# Patient Record
Sex: Female | Born: 1945 | Race: White | Hispanic: No | Smoking: Never smoker
Health system: Southern US, Community
[De-identification: ages and names within clinical notes are randomized; demographics above are authoritative.]

## PROBLEM LIST (undated history)

## (undated) DIAGNOSIS — I10 Essential (primary) hypertension: Secondary | ICD-10-CM

## (undated) DIAGNOSIS — H269 Unspecified cataract: Secondary | ICD-10-CM

## (undated) DIAGNOSIS — G43909 Migraine, unspecified, not intractable, without status migrainosus: Secondary | ICD-10-CM

## (undated) HISTORY — PX: TUBAL LIGATION: SHX77

---

## 2014-03-10 DIAGNOSIS — Z1211 Encounter for screening for malignant neoplasm of colon: Secondary | ICD-10-CM | POA: Diagnosis not present

## 2014-03-10 DIAGNOSIS — G43909 Migraine, unspecified, not intractable, without status migrainosus: Secondary | ICD-10-CM | POA: Diagnosis not present

## 2014-03-10 DIAGNOSIS — Z8249 Family history of ischemic heart disease and other diseases of the circulatory system: Secondary | ICD-10-CM | POA: Diagnosis not present

## 2014-07-20 DIAGNOSIS — Z1231 Encounter for screening mammogram for malignant neoplasm of breast: Secondary | ICD-10-CM | POA: Diagnosis not present

## 2014-07-30 DIAGNOSIS — Z961 Presence of intraocular lens: Secondary | ICD-10-CM | POA: Diagnosis not present

## 2014-08-04 DIAGNOSIS — G43709 Chronic migraine without aura, not intractable, without status migrainosus: Secondary | ICD-10-CM | POA: Diagnosis not present

## 2014-08-04 DIAGNOSIS — R350 Frequency of micturition: Secondary | ICD-10-CM | POA: Diagnosis not present

## 2014-08-04 DIAGNOSIS — F419 Anxiety disorder, unspecified: Secondary | ICD-10-CM | POA: Diagnosis not present

## 2014-10-06 DIAGNOSIS — R350 Frequency of micturition: Secondary | ICD-10-CM | POA: Diagnosis not present

## 2014-10-06 DIAGNOSIS — R21 Rash and other nonspecific skin eruption: Secondary | ICD-10-CM | POA: Diagnosis not present

## 2015-01-13 DIAGNOSIS — F419 Anxiety disorder, unspecified: Secondary | ICD-10-CM | POA: Diagnosis not present

## 2015-01-13 DIAGNOSIS — G43709 Chronic migraine without aura, not intractable, without status migrainosus: Secondary | ICD-10-CM | POA: Diagnosis not present

## 2015-01-13 DIAGNOSIS — R350 Frequency of micturition: Secondary | ICD-10-CM | POA: Diagnosis not present

## 2015-01-14 DIAGNOSIS — F419 Anxiety disorder, unspecified: Secondary | ICD-10-CM | POA: Diagnosis not present

## 2015-01-20 DIAGNOSIS — Z0000 Encounter for general adult medical examination without abnormal findings: Secondary | ICD-10-CM | POA: Diagnosis not present

## 2015-01-20 DIAGNOSIS — Z23 Encounter for immunization: Secondary | ICD-10-CM | POA: Diagnosis not present

## 2015-07-20 DIAGNOSIS — G43709 Chronic migraine without aura, not intractable, without status migrainosus: Secondary | ICD-10-CM | POA: Diagnosis not present

## 2015-07-20 DIAGNOSIS — R5383 Other fatigue: Secondary | ICD-10-CM | POA: Diagnosis not present

## 2015-07-20 DIAGNOSIS — F419 Anxiety disorder, unspecified: Secondary | ICD-10-CM | POA: Diagnosis not present

## 2015-07-22 DIAGNOSIS — Z1231 Encounter for screening mammogram for malignant neoplasm of breast: Secondary | ICD-10-CM | POA: Diagnosis not present

## 2015-08-05 DIAGNOSIS — R5383 Other fatigue: Secondary | ICD-10-CM | POA: Diagnosis not present

## 2015-09-08 DIAGNOSIS — R5383 Other fatigue: Secondary | ICD-10-CM | POA: Diagnosis not present

## 2015-09-08 DIAGNOSIS — D518 Other vitamin B12 deficiency anemias: Secondary | ICD-10-CM | POA: Diagnosis not present

## 2015-10-11 DIAGNOSIS — D518 Other vitamin B12 deficiency anemias: Secondary | ICD-10-CM | POA: Diagnosis not present

## 2015-10-20 DIAGNOSIS — D518 Other vitamin B12 deficiency anemias: Secondary | ICD-10-CM | POA: Diagnosis not present

## 2015-10-20 DIAGNOSIS — E559 Vitamin D deficiency, unspecified: Secondary | ICD-10-CM | POA: Diagnosis not present

## 2015-11-11 DIAGNOSIS — D518 Other vitamin B12 deficiency anemias: Secondary | ICD-10-CM | POA: Diagnosis not present

## 2015-11-26 DIAGNOSIS — M1712 Unilateral primary osteoarthritis, left knee: Secondary | ICD-10-CM | POA: Diagnosis not present

## 2015-11-26 DIAGNOSIS — M23222 Derangement of posterior horn of medial meniscus due to old tear or injury, left knee: Secondary | ICD-10-CM | POA: Diagnosis not present

## 2015-12-06 DIAGNOSIS — M23222 Derangement of posterior horn of medial meniscus due to old tear or injury, left knee: Secondary | ICD-10-CM | POA: Diagnosis not present

## 2015-12-06 DIAGNOSIS — M1712 Unilateral primary osteoarthritis, left knee: Secondary | ICD-10-CM | POA: Diagnosis not present

## 2015-12-14 DIAGNOSIS — D518 Other vitamin B12 deficiency anemias: Secondary | ICD-10-CM | POA: Diagnosis not present

## 2015-12-16 DIAGNOSIS — Z23 Encounter for immunization: Secondary | ICD-10-CM | POA: Diagnosis not present

## 2016-01-18 DIAGNOSIS — E559 Vitamin D deficiency, unspecified: Secondary | ICD-10-CM | POA: Diagnosis not present

## 2016-01-18 DIAGNOSIS — D518 Other vitamin B12 deficiency anemias: Secondary | ICD-10-CM | POA: Diagnosis not present

## 2016-02-11 DIAGNOSIS — R87610 Atypical squamous cells of undetermined significance on cytologic smear of cervix (ASC-US): Secondary | ICD-10-CM | POA: Diagnosis not present

## 2016-02-11 DIAGNOSIS — R85611 Atypical squamous cells cannot exclude high grade squamous intraepithelial lesion on cytologic smear of anus (ASC-H): Secondary | ICD-10-CM | POA: Diagnosis not present

## 2016-02-11 DIAGNOSIS — D518 Other vitamin B12 deficiency anemias: Secondary | ICD-10-CM | POA: Diagnosis not present

## 2016-02-11 DIAGNOSIS — Z0000 Encounter for general adult medical examination without abnormal findings: Secondary | ICD-10-CM | POA: Diagnosis not present

## 2016-02-11 DIAGNOSIS — Z23 Encounter for immunization: Secondary | ICD-10-CM | POA: Diagnosis not present

## 2016-03-10 DIAGNOSIS — R87610 Atypical squamous cells of undetermined significance on cytologic smear of cervix (ASC-US): Secondary | ICD-10-CM | POA: Diagnosis not present

## 2016-03-28 DIAGNOSIS — H524 Presbyopia: Secondary | ICD-10-CM | POA: Diagnosis not present

## 2016-04-24 DIAGNOSIS — R51 Headache: Secondary | ICD-10-CM | POA: Diagnosis not present

## 2016-04-24 DIAGNOSIS — J019 Acute sinusitis, unspecified: Secondary | ICD-10-CM | POA: Diagnosis not present

## 2016-04-24 DIAGNOSIS — R05 Cough: Secondary | ICD-10-CM | POA: Diagnosis not present

## 2016-05-18 DIAGNOSIS — Z8262 Family history of osteoporosis: Secondary | ICD-10-CM | POA: Diagnosis not present

## 2016-05-18 DIAGNOSIS — Z79899 Other long term (current) drug therapy: Secondary | ICD-10-CM | POA: Diagnosis not present

## 2016-05-18 DIAGNOSIS — Z780 Asymptomatic menopausal state: Secondary | ICD-10-CM | POA: Diagnosis not present

## 2016-05-18 DIAGNOSIS — M810 Age-related osteoporosis without current pathological fracture: Secondary | ICD-10-CM | POA: Diagnosis not present

## 2016-05-18 DIAGNOSIS — M1990 Unspecified osteoarthritis, unspecified site: Secondary | ICD-10-CM | POA: Diagnosis not present

## 2016-08-04 DIAGNOSIS — Z1231 Encounter for screening mammogram for malignant neoplasm of breast: Secondary | ICD-10-CM | POA: Diagnosis not present

## 2016-08-09 DIAGNOSIS — Z1389 Encounter for screening for other disorder: Secondary | ICD-10-CM | POA: Diagnosis not present

## 2016-08-09 DIAGNOSIS — M85842 Other specified disorders of bone density and structure, left hand: Secondary | ICD-10-CM | POA: Diagnosis not present

## 2016-08-09 DIAGNOSIS — R51 Headache: Secondary | ICD-10-CM | POA: Diagnosis not present

## 2016-08-09 DIAGNOSIS — R350 Frequency of micturition: Secondary | ICD-10-CM | POA: Diagnosis not present

## 2017-01-02 DIAGNOSIS — Z23 Encounter for immunization: Secondary | ICD-10-CM | POA: Diagnosis not present

## 2017-02-22 DIAGNOSIS — Z23 Encounter for immunization: Secondary | ICD-10-CM | POA: Diagnosis not present

## 2017-02-22 DIAGNOSIS — Z0000 Encounter for general adult medical examination without abnormal findings: Secondary | ICD-10-CM | POA: Diagnosis not present

## 2017-07-04 DIAGNOSIS — H04121 Dry eye syndrome of right lacrimal gland: Secondary | ICD-10-CM | POA: Diagnosis not present

## 2017-07-04 DIAGNOSIS — H524 Presbyopia: Secondary | ICD-10-CM | POA: Diagnosis not present

## 2017-08-16 DIAGNOSIS — Z1231 Encounter for screening mammogram for malignant neoplasm of breast: Secondary | ICD-10-CM | POA: Diagnosis not present

## 2018-02-22 ENCOUNTER — Emergency Department (HOSPITAL_COMMUNITY)
Admission: EM | Admit: 2018-02-22 | Discharge: 2018-02-22 | Disposition: A | Discharge status: Home / Self Care | Payer: Medicare Other | Attending: Emergency Medicine | Admitting: Emergency Medicine

## 2018-02-22 ENCOUNTER — Emergency Department (HOSPITAL_COMMUNITY): Payer: Medicare Other

## 2018-02-22 ENCOUNTER — Other Ambulatory Visit: Payer: Self-pay

## 2018-02-22 ENCOUNTER — Encounter (HOSPITAL_COMMUNITY): Payer: Self-pay | Admitting: Emergency Medicine

## 2018-02-22 DIAGNOSIS — R1084 Generalized abdominal pain: Secondary | ICD-10-CM | POA: Diagnosis not present

## 2018-02-22 DIAGNOSIS — N3001 Acute cystitis with hematuria: Secondary | ICD-10-CM | POA: Diagnosis not present

## 2018-02-22 DIAGNOSIS — R109 Unspecified abdominal pain: Secondary | ICD-10-CM | POA: Diagnosis not present

## 2018-02-22 HISTORY — DX: Migraine, unspecified, not intractable, without status migrainosus: G43.909

## 2018-02-22 LAB — CBC WITH DIFFERENTIAL/PLATELET
Abs Immature Granulocytes: 0.03 10*3/uL (ref 0.00–0.07)
Basophils Absolute: 0.1 10*3/uL (ref 0.0–0.1)
Basophils Relative: 1 %
Eosinophils Absolute: 0.3 10*3/uL (ref 0.0–0.5)
Eosinophils Relative: 3 %
HCT: 40.2 % (ref 36.0–46.0)
Hemoglobin: 13.1 g/dL (ref 12.0–15.0)
Immature Granulocytes: 0 %
Lymphocytes Relative: 19 %
Lymphs Abs: 1.8 10*3/uL (ref 0.7–4.0)
MCH: 30.8 pg (ref 26.0–34.0)
MCHC: 32.6 g/dL (ref 30.0–36.0)
MCV: 94.6 fL (ref 80.0–100.0)
Monocytes Absolute: 0.8 10*3/uL (ref 0.1–1.0)
Monocytes Relative: 9 %
NEUTROS ABS: 6.4 10*3/uL (ref 1.7–7.7)
NEUTROS PCT: 68 %
Platelets: 335 10*3/uL (ref 150–400)
RBC: 4.25 MIL/uL (ref 3.87–5.11)
RDW: 13.4 % (ref 11.5–15.5)
WBC: 9.3 10*3/uL (ref 4.0–10.5)
nRBC: 0 % (ref 0.0–0.2)

## 2018-02-22 LAB — URINALYSIS, ROUTINE W REFLEX MICROSCOPIC
Bilirubin Urine: NEGATIVE
Glucose, UA: NEGATIVE mg/dL
Ketones, ur: NEGATIVE mg/dL
Nitrite: NEGATIVE
PH: 5 (ref 5.0–8.0)
Protein, ur: 30 mg/dL — AB
RBC / HPF: >50 RBC/hpf — ABNORMAL HIGH (ref 0–5)
Specific Gravity, Urine: 1.029 (ref 1.005–1.030)

## 2018-02-22 LAB — COMPREHENSIVE METABOLIC PANEL
ALBUMIN: 4.4 g/dL (ref 3.5–5.0)
ALT: 33 U/L (ref 0–44)
AST: 39 U/L (ref 15–41)
Alkaline Phosphatase: 67 U/L (ref 38–126)
Anion gap: 10 (ref 5–15)
BUN: 24 mg/dL — ABNORMAL HIGH (ref 8–23)
CHLORIDE: 108 mmol/L (ref 98–111)
CO2: 22 mmol/L (ref 22–32)
Calcium: 9.4 mg/dL (ref 8.9–10.3)
Creatinine, Ser: 0.81 mg/dL (ref 0.44–1.00)
GFR calc Af Amer: >60 mL/min (ref >60)
GFR calc non Af Amer: >60 mL/min (ref >60)
Glucose, Bld: 84 mg/dL (ref 70–99)
POTASSIUM: 4.1 mmol/L (ref 3.5–5.1)
Sodium: 140 mmol/L (ref 135–145)
Total Bilirubin: 1 mg/dL (ref 0.3–1.2)
Total Protein: 7.3 g/dL (ref 6.5–8.1)

## 2018-02-22 LAB — LIPASE, BLOOD: Lipase: 34 U/L (ref 11–51)

## 2018-02-22 MED ORDER — SODIUM CHLORIDE 0.9 % IV SOLN
INTRAVENOUS | Status: DC
  Administered 2018-02-22: 19:00:00 via INTRAVENOUS

## 2018-02-22 MED ORDER — ONDANSETRON HCL 4 MG/2ML IJ SOLN
4.0000 mg | Freq: Once | INTRAMUSCULAR | Status: AC
  Administered 2018-02-22: 4 mg via INTRAVENOUS
  Filled 2018-02-22: qty 2

## 2018-02-22 MED ORDER — MORPHINE SULFATE (PF) 4 MG/ML IV SOLN
4.0000 mg | Freq: Once | INTRAVENOUS | Status: AC
  Administered 2018-02-22: 4 mg via INTRAVENOUS
  Filled 2018-02-22: qty 1

## 2018-02-22 MED ORDER — SODIUM CHLORIDE 0.9 % IV BOLUS
250.0000 mL | Freq: Once | INTRAVENOUS | Status: AC
  Administered 2018-02-22: 250 mL via INTRAVENOUS

## 2018-02-22 MED ORDER — SODIUM CHLORIDE 0.9 % IV SOLN
1.0000 g | Freq: Once | INTRAVENOUS | Status: AC
  Administered 2018-02-22: 1 g via INTRAVENOUS
  Filled 2018-02-22: qty 10

## 2018-02-22 MED ORDER — IOPAMIDOL (ISOVUE-300) INJECTION 61%
80.0000 mL | Freq: Once | INTRAVENOUS | Status: AC | PRN
  Administered 2018-02-22: 80 mL via INTRAVENOUS

## 2018-02-22 MED ORDER — CEPHALEXIN 500 MG PO CAPS: 500.0000 mg | ORAL_CAPSULE | Freq: Four times a day (QID) | ORAL | 0 refills | Status: DC

## 2018-02-22 MED ORDER — ONDANSETRON HCL 4 MG/2ML IJ SOLN: 4.0000 mg | Freq: Once | INTRAMUSCULAR | Status: DC

## 2018-02-22 MED ORDER — ONDANSETRON 4 MG PO TBDP: 4.0000 mg | ORAL_TABLET | Freq: Three times a day (TID) | ORAL | 1 refills | Status: DC | PRN

## 2018-02-22 NOTE — ED Notes (Signed)
Patient transported to CT 

## 2018-02-22 NOTE — Discharge Instructions (Addendum)
CT scan without any significant findings.  Symptoms may be secondary to urinary tract infection.  Take the antibiotic Keflex as directed for the next 7 days.  Would expect improvement over the next 2 days.  Return for any new or worse symptoms.  He also a prescription for Zofran for any nausea or vomiting.

## 2018-02-22 NOTE — ED Notes (Signed)
Spoke with Chrissie NoaWilliam in CT- reports on way to get pt for scan.

## 2018-02-22 NOTE — ED Provider Notes (Signed)
[facility] EMERGENCY DEPARTMENT Provider Note   CSN: [id number] Arrival date & time: 02/22/18  1805     History   Chief Complaint Chief Complaint  Patient presents with  . Flank Pain    HPI Crystal Price is a 72 y.o. female.  HPI Patient presented to the emergency room for evaluation of flank pain.  Patient states the symptoms started about 15 minutes prior to arrival.  She was sitting down to eat.  She had a bite or 2 of beef when she had the pain which is sharp and severe.  It radiates to her back.  No nausea or vomiting.  No cp or sob.  No urinary sx.  Pt denies prior history of same. Past Medical History:  Diagnosis Date  . Migraine     There are no active problems to display for this patient.   Past Surgical History:  Procedure Laterality Date  . TUBAL LIGATION       OB History   No obstetric history on file.      Home Medications    Prior to Admission medications   Not on File    Family History History reviewed. No pertinent family history.  Social History Social History   Tobacco Use  . Smoking status: Never Smoker  . Smokeless tobacco: Never Used  Substance Use Topics  . Alcohol use: Yes    Frequency: Never    Comment: rarely  . Drug use: Never     Allergies   Patient has no known allergies.   Review of Systems Review of Systems  All other systems reviewed and are negative.    Physical Exam Updated Vital Signs BP (!) 177/99 (BP Location: Right Arm)   Pulse 84   Temp 97.8 F (36.6 C) (Oral)   Resp 20   Ht 1.6 m (5\' 3" )   Wt 68 kg   SpO2 99%   BMI 26.57 kg/m   Physical Exam Vitals signs and nursing note reviewed.  Constitutional:      General: She is not in acute distress.    Appearance: She is well-developed.  HENT:     Head: Normocephalic and atraumatic.     Right Ear: External ear normal.     Left Ear: External ear normal.  Eyes:     General: No scleral icterus.       Right eye: No discharge.        Left eye:  No discharge.     Conjunctiva/sclera: Conjunctivae normal.  Neck:     Musculoskeletal: Neck supple.     Trachea: No tracheal deviation.  Cardiovascular:     Rate and Rhythm: Normal rate and regular rhythm.  Pulmonary:     Effort: Pulmonary effort is normal. No respiratory distress.     Breath sounds: Normal breath sounds. No stridor. No wheezing or rales.  Abdominal:     General: Bowel sounds are normal. There is no distension.     Palpations: Abdomen is soft.     Tenderness: There is no abdominal tenderness. There is no guarding or rebound.  Musculoskeletal:        General: No tenderness.  Skin:    General: Skin is warm and dry.     Findings: No rash.  Neurological:     Mental Status: She is alert.     Cranial Nerves: No cranial nerve deficit (no facial droop, extraocular movements intact, no slurred speech).     Sensory: No sensory deficit.  Motor: No abnormal muscle tone or seizure activity.     Coordination: Coordination normal.      ED Treatments / Results  Labs (all labs ordered are listed, but only abnormal results are displayed) Labs Reviewed  COMPREHENSIVE METABOLIC PANEL  LIPASE, BLOOD  CBC WITH DIFFERENTIAL/PLATELET  URINALYSIS, ROUTINE W REFLEX MICROSCOPIC    EKG None  Radiology No results found.  Procedures Procedures (including critical care time)  Medications Ordered in ED Medications  sodium chloride 0.9 % bolus 250 mL (has no administration in time range)    And  0.9 %  sodium chloride infusion (has no administration in time range)  ondansetron (ZOFRAN) injection 4 mg (has no administration in time range)  morphine 4 MG/ML injection 4 mg (has no administration in time range)     Initial Impression / Assessment and Plan / ED Course  I have reviewed the triage vital signs and the nursing notes.  Pertinent labs & imaging results that were available during my care of the patient were reviewed by me and considered in my medical decision  making (see chart for details).  Clinical Course as of Feb 22 2157  Fri Feb 22, 2018  1952 Still having severe pain.  Requiring another dose of pain meds.  Will CT   [JK]    Clinical Course User Index [JK] Linwood DibblesKnapp, Arber Wiemers, MD    Labs reviewed.  UA notable for pyuria and hematuria.  CT scan pending.  If no gallstones or kidney stones, wiill treat for UTI.  Dr Deretha EmoryZackowski will follow up on the CT scan.  Final Clinical Impressions(s) / ED Diagnoses  pending   Linwood DibblesKnapp, Namir Neto, MD 02/22/18 2159

## 2018-02-22 NOTE — ED Triage Notes (Signed)
Patient complaining of right flank pain starting 15 minutes prior to arrival to ER. Denies dysuria.

## 2018-02-22 NOTE — ED Notes (Signed)
Pt back from CT

## 2018-02-22 NOTE — ED Notes (Signed)
Ct called - informed Crystal Price that pt is ready for scan.

## 2018-02-22 NOTE — ED Provider Notes (Signed)
Patient's CT scan without any significant abnormalities.  Gallbladder normal.  No evidence of any renal stones.  Based on the urinalysis symptoms may be secondary to urinary tract infection bladder infection.  Will treat with 1 dose of Rocephin here and continue Keflex.  Urine sent for culture.  Patient's vital signs here nontoxic no acute distress.   Results for orders placed or performed during the hospital encounter of 02/22/18  Comprehensive metabolic panel  Result Value Ref Range   Sodium 140 135 - 145 mmol/L   Potassium 4.1 3.5 - 5.1 mmol/L   Chloride 108 98 - 111 mmol/L   CO2 22 22 - 32 mmol/L   Glucose, Bld 84 70 - 99 mg/dL   BUN 24 (H) 8 - 23 mg/dL   Creatinine, Ser 1.910.81 0.44 - 1.00 mg/dL   Calcium 9.4 8.9 - 47.810.3 mg/dL   Total Protein 7.3 6.5 - 8.1 g/dL   Albumin 4.4 3.5 - 5.0 g/dL   AST 39 15 - 41 U/L   ALT 33 0 - 44 U/L   Alkaline Phosphatase 67 38 - 126 U/L   Total Bilirubin 1.0 0.3 - 1.2 mg/dL   GFR calc non Af Amer >60 >60 mL/min   GFR calc Af Amer >60 >60 mL/min   Anion gap 10 5 - 15  Lipase, blood  Result Value Ref Range   Lipase 34 11 - 51 U/L  CBC WITH DIFFERENTIAL  Result Value Ref Range   WBC 9.3 4.0 - 10.5 K/uL   RBC 4.25 3.87 - 5.11 MIL/uL   Hemoglobin 13.1 12.0 - 15.0 g/dL   HCT 29.540.2 62.136.0 - 30.846.0 %   MCV 94.6 80.0 - 100.0 fL   MCH 30.8 26.0 - 34.0 pg   MCHC 32.6 30.0 - 36.0 g/dL   RDW 65.713.4 84.611.5 - 96.215.5 %   Platelets 335 150 - 400 K/uL   nRBC 0.0 0.0 - 0.2 %   Neutrophils Relative % 68 %   Neutro Abs 6.4 1.7 - 7.7 K/uL   Lymphocytes Relative 19 %   Lymphs Abs 1.8 0.7 - 4.0 K/uL   Monocytes Relative 9 %   Monocytes Absolute 0.8 0.1 - 1.0 K/uL   Eosinophils Relative 3 %   Eosinophils Absolute 0.3 0.0 - 0.5 K/uL   Basophils Relative 1 %   Basophils Absolute 0.1 0.0 - 0.1 K/uL   Immature Granulocytes 0 %   Abs Immature Granulocytes 0.03 0.00 - 0.07 K/uL  Urinalysis, Routine w reflex microscopic  Result Value Ref Range   Color, Urine YELLOW YELLOW   APPearance HAZY (A) CLEAR   Specific Gravity, Urine 1.029 1.005 - 1.030   pH 5.0 5.0 - 8.0   Glucose, UA NEGATIVE NEGATIVE mg/dL   Hgb urine dipstick LARGE (A) NEGATIVE   Bilirubin Urine NEGATIVE NEGATIVE   Ketones, ur NEGATIVE NEGATIVE mg/dL   Protein, ur 30 (A) NEGATIVE mg/dL   Nitrite NEGATIVE NEGATIVE   Leukocytes, UA MODERATE (A) NEGATIVE   RBC / HPF >50 (H) 0 - 5 RBC/hpf   WBC, UA 21-50 0 - 5 WBC/hpf   Bacteria, UA RARE (A) NONE SEEN   Squamous Epithelial / LPF 0-5 0 - 5   Mucus PRESENT    Ca Oxalate Crys, UA PRESENT       Vanetta MuldersZackowski, Jacinto Keil, MD 02/22/18 2234

## 2018-02-24 LAB — URINE CULTURE

## 2018-03-26 DIAGNOSIS — J069 Acute upper respiratory infection, unspecified: Secondary | ICD-10-CM | POA: Diagnosis not present

## 2018-03-26 DIAGNOSIS — G43709 Chronic migraine without aura, not intractable, without status migrainosus: Secondary | ICD-10-CM | POA: Diagnosis not present

## 2018-03-26 DIAGNOSIS — Z0000 Encounter for general adult medical examination without abnormal findings: Secondary | ICD-10-CM | POA: Diagnosis not present

## 2018-09-05 DIAGNOSIS — M1711 Unilateral primary osteoarthritis, right knee: Secondary | ICD-10-CM | POA: Diagnosis not present

## 2018-09-05 DIAGNOSIS — M23303 Other meniscus derangements, unspecified medial meniscus, right knee: Secondary | ICD-10-CM | POA: Diagnosis not present

## 2018-11-18 DIAGNOSIS — Z1231 Encounter for screening mammogram for malignant neoplasm of breast: Secondary | ICD-10-CM | POA: Diagnosis not present

## 2019-04-28 DIAGNOSIS — G43709 Chronic migraine without aura, not intractable, without status migrainosus: Secondary | ICD-10-CM | POA: Diagnosis not present

## 2019-04-28 DIAGNOSIS — R350 Frequency of micturition: Secondary | ICD-10-CM | POA: Diagnosis not present

## 2019-10-10 DIAGNOSIS — Z13220 Encounter for screening for lipoid disorders: Secondary | ICD-10-CM | POA: Diagnosis not present

## 2019-10-10 DIAGNOSIS — R5383 Other fatigue: Secondary | ICD-10-CM | POA: Diagnosis not present

## 2019-10-16 DIAGNOSIS — Z0000 Encounter for general adult medical examination without abnormal findings: Secondary | ICD-10-CM | POA: Diagnosis not present

## 2019-10-16 DIAGNOSIS — G43709 Chronic migraine without aura, not intractable, without status migrainosus: Secondary | ICD-10-CM | POA: Diagnosis not present

## 2019-12-22 DIAGNOSIS — H524 Presbyopia: Secondary | ICD-10-CM | POA: Diagnosis not present

## 2020-02-07 IMAGING — CT CT ABD-PELV W/ CM
2 of 5 series · 16 of 46 positions shown, 18 images · IV contrast (Isovue)
Comparison: None.

CLINICAL DATA: Acute onset of right flank pain and generalized
abdominal pain.

EXAM:
CT ABDOMEN AND PELVIS WITH CONTRAST
TECHNIQUE: Multidetector CT imaging of the abdomen and pelvis was performed
using the standard protocol following bolus administration of
intravenous contrast.
CONTRAST:  80mL 6M78VO-MWW IOPAMIDOL (6M78VO-MWW) INJECTION 61%

[Series 2: axial st · axial · 0.67mm/px · z∈[+772,+1132]mm · 13 of 81 slices shown, 15 images]
[im 5/81  soft-tissue]
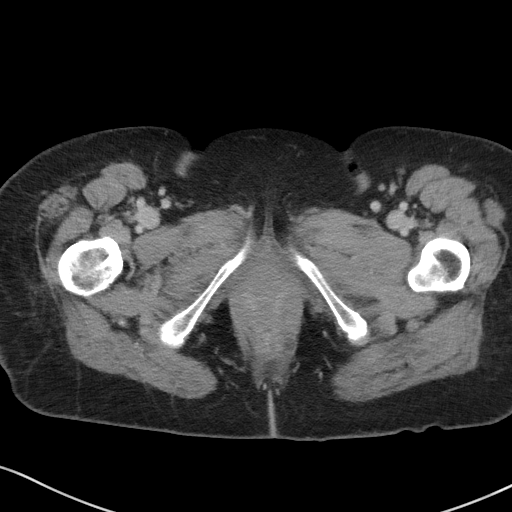
[im 5/81  bone]
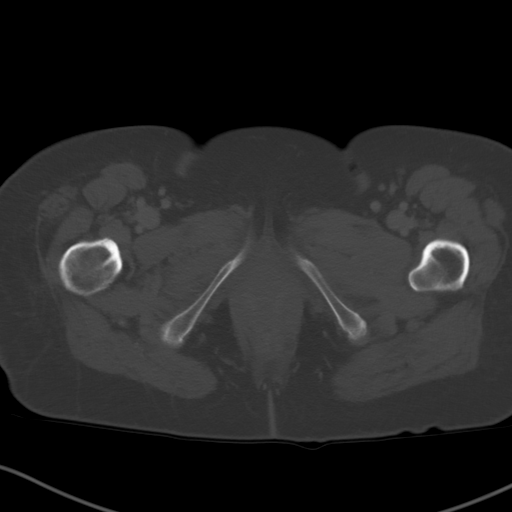
[im 13/81  soft-tissue]
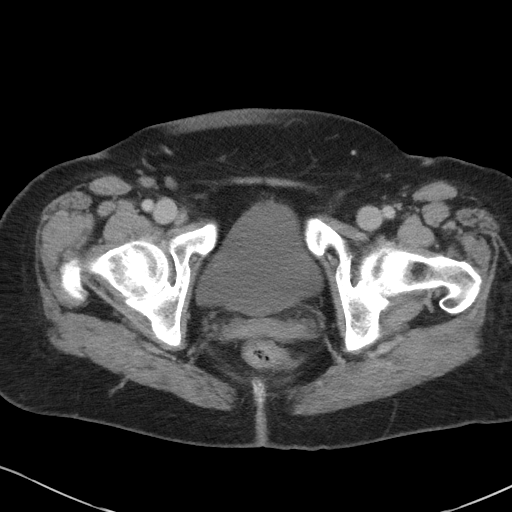
[im 17/81  soft-tissue]
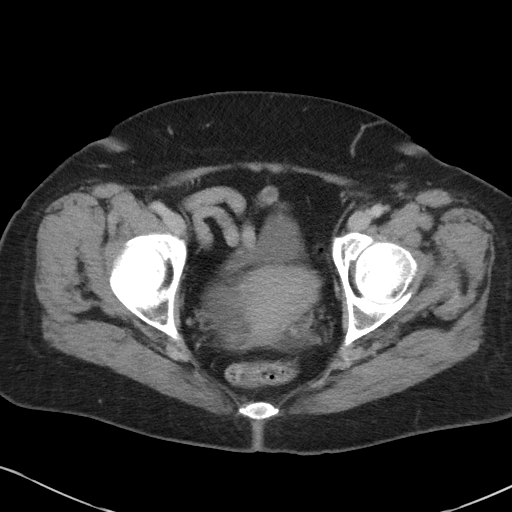
[im 25/81  soft-tissue]
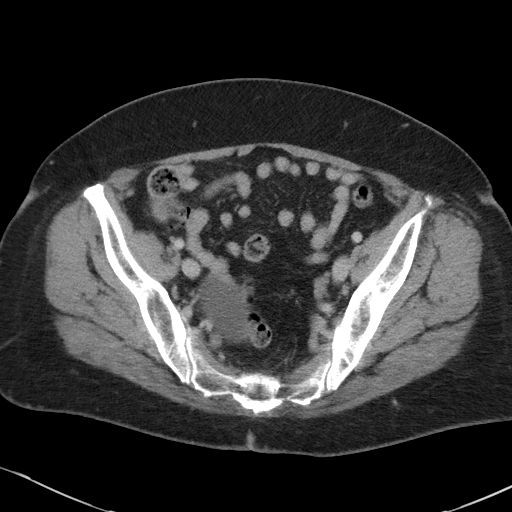
[im 29/81  soft-tissue]
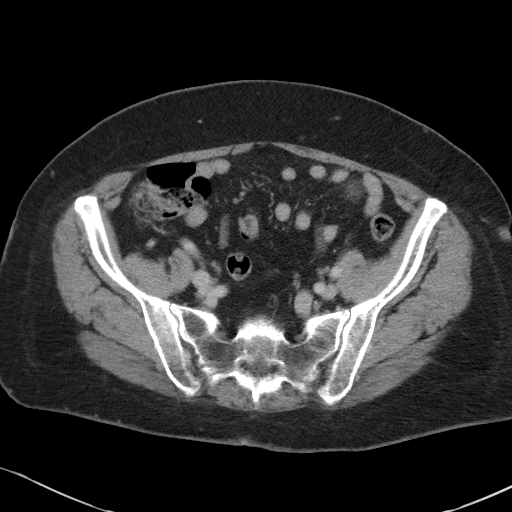
[im 37/81  soft-tissue]
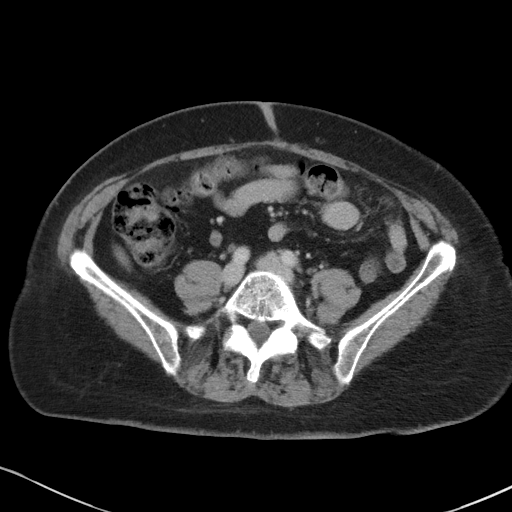
[im 41/81  soft-tissue]
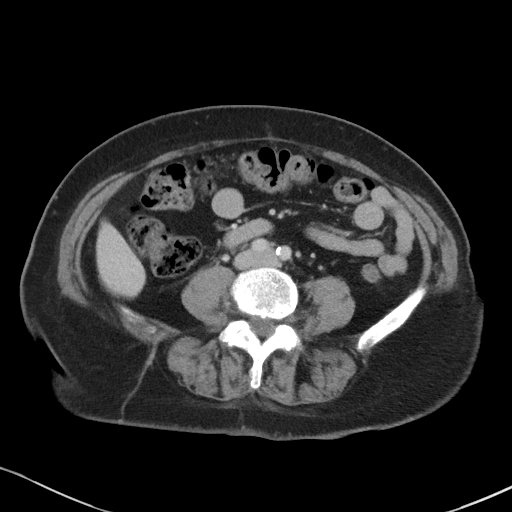
[im 45/81  soft-tissue]
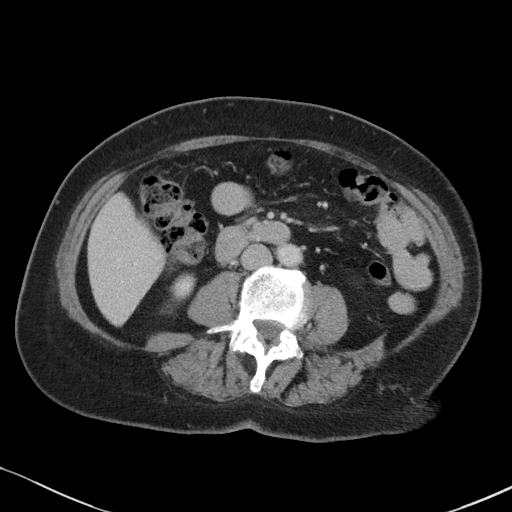
[im 53/81  soft-tissue]
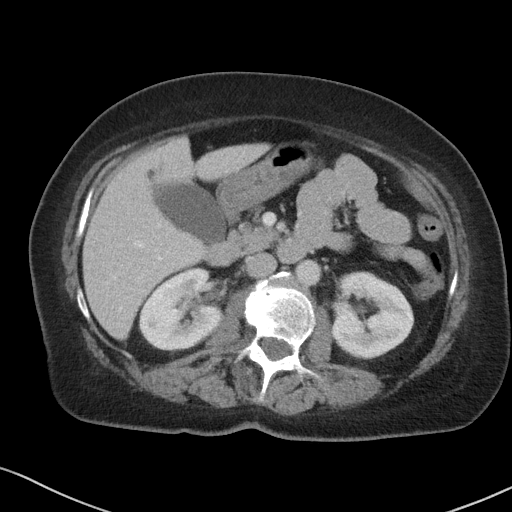
[im 53/81  bone]
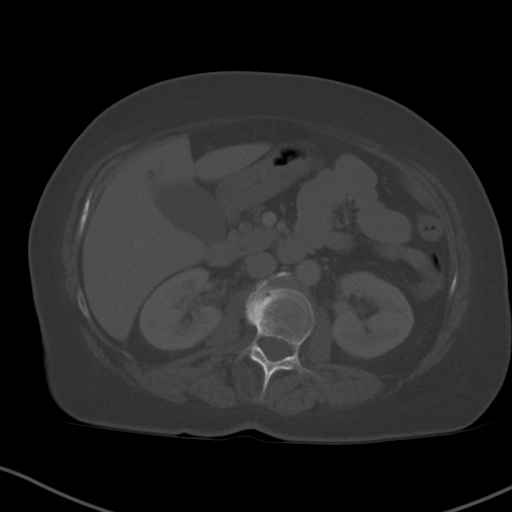
[im 57/81  soft-tissue]
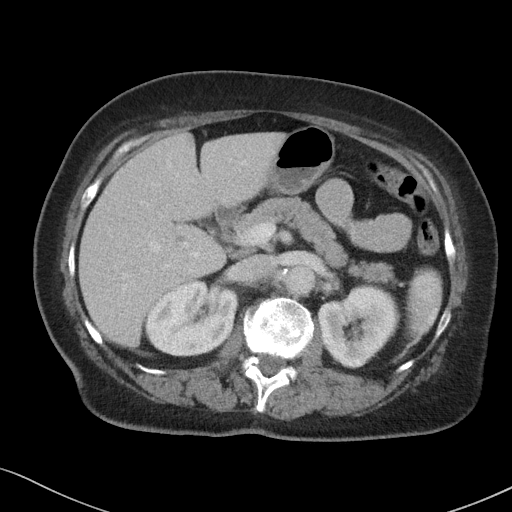
[im 65/81  soft-tissue]
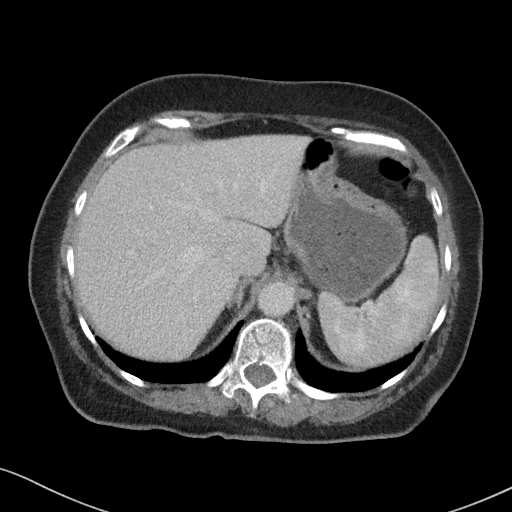
[im 69/81  soft-tissue]
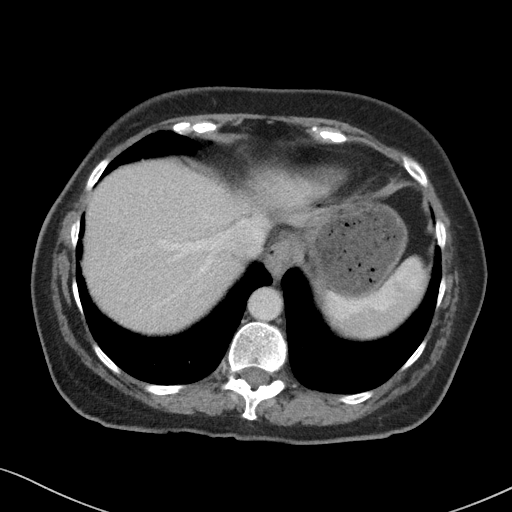
[im 77/81  soft-tissue]
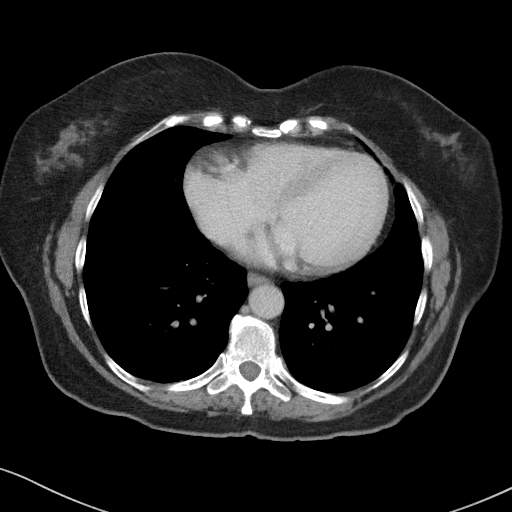

[Series 5: coronal st · coronal · 0.71mm/px · 3 of 87 slices shown]
[im 29/87  soft-tissue]
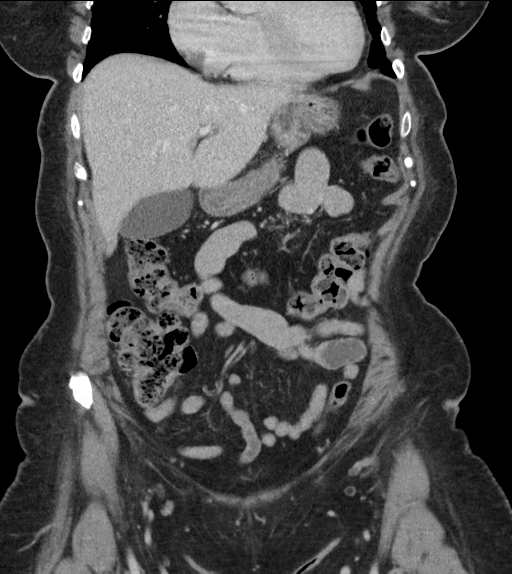
[im 39/87  soft-tissue]
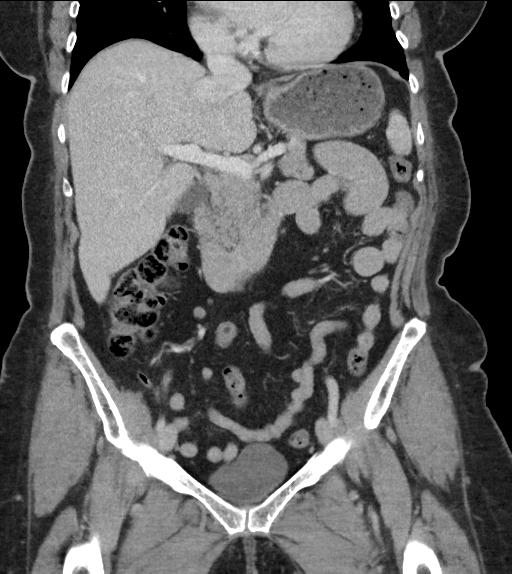
[im 48/87  soft-tissue]
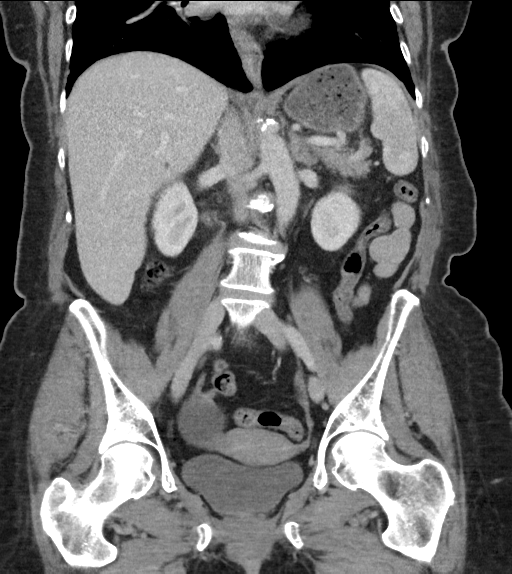

[16 of 46 positions shown; findings below may reference images not displayed]

FINDINGS: Lower chest: The visualized lung bases are grossly clear. The
visualized portions of the mediastinum are unremarkable.

Hepatobiliary: A few tiny hypodensities are seen within the liver,
measuring up to 5 mm in size. The gallbladder is unremarkable in
appearance. The common bile duct remains normal in caliber.

Pancreas: The pancreas is within normal limits.

Spleen: The spleen is unremarkable in appearance.

Adrenals/Urinary Tract: The adrenal glands are unremarkable in
appearance. The kidneys are within normal limits. There is no
evidence of hydronephrosis. No renal or ureteral stones are
identified. No perinephric stranding is seen.

Stomach/Bowel: The stomach is unremarkable in appearance. The small
bowel is within normal limits. The appendix is normal in caliber,
without evidence of appendicitis. The colon is unremarkable in
appearance.

Vascular/Lymphatic: Scattered calcification is seen along the
abdominal aorta and its branches. The abdominal aorta is otherwise
grossly unremarkable. The inferior vena cava is grossly
unremarkable. No retroperitoneal lymphadenopathy is seen. No pelvic
sidewall lymphadenopathy is identified.

Reproductive: The bladder is mildly distended and grossly
unremarkable. The uterus is unremarkable in appearance. Incidental
note is made of a large 6.6 cm right adnexal cystic lesion with a
small focus of calcification.

Other: No additional soft tissue abnormalities are seen.

Musculoskeletal: No acute osseous abnormalities are identified.
There is mild chronic loss of height at vertebral bodies T12 and L1.
The visualized musculature is unremarkable in appearance.
IMPRESSION: 1. No acute abnormality seen to explain the patient's symptoms.
2. Large 6.6 cm right adnexal cystic lesion with a small focus of
calcification. Pelvic ultrasound would be helpful for further
evaluation, when and as deemed clinically appropriate.
3. Few tiny hypodensities within the liver, measuring up to 5 mm in
size. These are nonspecific in appearance, though likely benign.
Would correlate with LFTs.

Aortic Atherosclerosis ([id number].B).

## 2020-04-20 DIAGNOSIS — R519 Headache, unspecified: Secondary | ICD-10-CM | POA: Diagnosis not present

## 2020-04-20 DIAGNOSIS — R350 Frequency of micturition: Secondary | ICD-10-CM | POA: Diagnosis not present

## 2020-10-12 DIAGNOSIS — E559 Vitamin D deficiency, unspecified: Secondary | ICD-10-CM | POA: Diagnosis not present

## 2020-10-12 DIAGNOSIS — Z13220 Encounter for screening for lipoid disorders: Secondary | ICD-10-CM | POA: Diagnosis not present

## 2020-10-12 DIAGNOSIS — E7849 Other hyperlipidemia: Secondary | ICD-10-CM | POA: Diagnosis not present

## 2020-10-15 DIAGNOSIS — R519 Headache, unspecified: Secondary | ICD-10-CM | POA: Diagnosis not present

## 2021-04-15 DIAGNOSIS — E785 Hyperlipidemia, unspecified: Secondary | ICD-10-CM | POA: Diagnosis not present

## 2021-04-15 DIAGNOSIS — E559 Vitamin D deficiency, unspecified: Secondary | ICD-10-CM | POA: Diagnosis not present

## 2021-04-15 DIAGNOSIS — R5383 Other fatigue: Secondary | ICD-10-CM | POA: Diagnosis not present

## 2021-04-15 DIAGNOSIS — Z1329 Encounter for screening for other suspected endocrine disorder: Secondary | ICD-10-CM | POA: Diagnosis not present

## 2021-04-15 DIAGNOSIS — Z13220 Encounter for screening for lipoid disorders: Secondary | ICD-10-CM | POA: Diagnosis not present

## 2021-04-18 DIAGNOSIS — Z1389 Encounter for screening for other disorder: Secondary | ICD-10-CM | POA: Diagnosis not present

## 2021-04-18 DIAGNOSIS — M1712 Unilateral primary osteoarthritis, left knee: Secondary | ICD-10-CM | POA: Diagnosis not present

## 2021-04-18 DIAGNOSIS — Z0000 Encounter for general adult medical examination without abnormal findings: Secondary | ICD-10-CM | POA: Diagnosis not present

## 2021-06-21 DIAGNOSIS — Z1231 Encounter for screening mammogram for malignant neoplasm of breast: Secondary | ICD-10-CM | POA: Diagnosis not present

## 2021-10-11 DIAGNOSIS — M810 Age-related osteoporosis without current pathological fracture: Secondary | ICD-10-CM | POA: Diagnosis not present

## 2021-10-11 DIAGNOSIS — M8589 Other specified disorders of bone density and structure, multiple sites: Secondary | ICD-10-CM | POA: Diagnosis not present

## 2022-01-16 DIAGNOSIS — R030 Elevated blood-pressure reading, without diagnosis of hypertension: Secondary | ICD-10-CM | POA: Diagnosis not present

## 2022-02-10 DIAGNOSIS — H524 Presbyopia: Secondary | ICD-10-CM | POA: Diagnosis not present

## 2022-03-02 DIAGNOSIS — H35363 Drusen (degenerative) of macula, bilateral: Secondary | ICD-10-CM | POA: Diagnosis not present

## 2022-03-02 DIAGNOSIS — H35033 Hypertensive retinopathy, bilateral: Secondary | ICD-10-CM | POA: Diagnosis not present

## 2022-03-02 DIAGNOSIS — H26492 Other secondary cataract, left eye: Secondary | ICD-10-CM | POA: Diagnosis not present

## 2022-03-02 DIAGNOSIS — H35013 Changes in retinal vascular appearance, bilateral: Secondary | ICD-10-CM | POA: Diagnosis not present

## 2022-03-02 DIAGNOSIS — Z961 Presence of intraocular lens: Secondary | ICD-10-CM | POA: Diagnosis not present

## 2022-05-01 DIAGNOSIS — I10 Essential (primary) hypertension: Secondary | ICD-10-CM | POA: Diagnosis not present

## 2022-05-15 DIAGNOSIS — Z1389 Encounter for screening for other disorder: Secondary | ICD-10-CM | POA: Diagnosis not present

## 2022-05-15 DIAGNOSIS — I10 Essential (primary) hypertension: Secondary | ICD-10-CM | POA: Diagnosis not present

## 2022-06-28 DIAGNOSIS — J029 Acute pharyngitis, unspecified: Secondary | ICD-10-CM | POA: Diagnosis not present

## 2022-06-28 DIAGNOSIS — R5383 Other fatigue: Secondary | ICD-10-CM | POA: Diagnosis not present

## 2022-07-10 DIAGNOSIS — Z1231 Encounter for screening mammogram for malignant neoplasm of breast: Secondary | ICD-10-CM | POA: Diagnosis not present

## 2022-08-09 DIAGNOSIS — I10 Essential (primary) hypertension: Secondary | ICD-10-CM | POA: Diagnosis not present

## 2022-08-24 DIAGNOSIS — H35363 Drusen (degenerative) of macula, bilateral: Secondary | ICD-10-CM | POA: Diagnosis not present

## 2022-12-24 ENCOUNTER — Observation Stay (HOSPITAL_COMMUNITY)
Admission: EM | Admit: 2022-12-24 | Discharge: 2022-12-25 | Disposition: A | Discharge status: Home / Self Care | Payer: Medicare Other | Attending: Internal Medicine | Admitting: Internal Medicine

## 2022-12-24 ENCOUNTER — Emergency Department (HOSPITAL_COMMUNITY): Payer: Medicare Other

## 2022-12-24 ENCOUNTER — Encounter (HOSPITAL_COMMUNITY): Payer: Self-pay

## 2022-12-24 ENCOUNTER — Other Ambulatory Visit: Payer: Self-pay

## 2022-12-24 DIAGNOSIS — Z743 Need for continuous supervision: Secondary | ICD-10-CM | POA: Diagnosis not present

## 2022-12-24 DIAGNOSIS — R2681 Unsteadiness on feet: Secondary | ICD-10-CM | POA: Diagnosis not present

## 2022-12-24 DIAGNOSIS — I10 Essential (primary) hypertension: Secondary | ICD-10-CM | POA: Insufficient documentation

## 2022-12-24 DIAGNOSIS — R6889 Other general symptoms and signs: Secondary | ICD-10-CM | POA: Diagnosis not present

## 2022-12-24 DIAGNOSIS — R1011 Right upper quadrant pain: Principal | ICD-10-CM | POA: Insufficient documentation

## 2022-12-24 DIAGNOSIS — N200 Calculus of kidney: Secondary | ICD-10-CM | POA: Diagnosis not present

## 2022-12-24 DIAGNOSIS — Z79899 Other long term (current) drug therapy: Secondary | ICD-10-CM | POA: Insufficient documentation

## 2022-12-24 DIAGNOSIS — R109 Unspecified abdominal pain: Principal | ICD-10-CM

## 2022-12-24 DIAGNOSIS — K449 Diaphragmatic hernia without obstruction or gangrene: Secondary | ICD-10-CM | POA: Diagnosis not present

## 2022-12-24 DIAGNOSIS — R112 Nausea with vomiting, unspecified: Secondary | ICD-10-CM | POA: Diagnosis present

## 2022-12-24 HISTORY — DX: Essential (primary) hypertension: I10

## 2022-12-24 HISTORY — DX: Unspecified cataract: H26.9

## 2022-12-24 LAB — CBC
HCT: 35.1 % — ABNORMAL LOW (ref 36.0–46.0)
HCT: 38 % (ref 36.0–46.0)
Hemoglobin: 12.1 g/dL (ref 12.0–15.0)
Hemoglobin: 12.7 g/dL (ref 12.0–15.0)
MCH: 32.8 pg (ref 26.0–34.0)
MCH: 32.6 pg (ref 26.0–34.0)
MCHC: 34.5 g/dL (ref 30.0–36.0)
MCHC: 33.4 g/dL (ref 30.0–36.0)
MCV: 95.1 fL (ref 80.0–100.0)
MCV: 97.4 fL (ref 80.0–100.0)
Platelets: 355 10*3/uL (ref 150–400)
Platelets: 368 10*3/uL (ref 150–400)
RBC: 3.69 MIL/uL — ABNORMAL LOW (ref 3.87–5.11)
RBC: 3.9 MIL/uL (ref 3.87–5.11)
RDW: 13.3 % (ref 11.5–15.5)
RDW: 13.5 % (ref 11.5–15.5)
WBC: 7.1 10*3/uL (ref 4.0–10.5)
WBC: 7.4 10*3/uL (ref 4.0–10.5)
nRBC: 0 % (ref 0.0–0.2)
nRBC: 0 % (ref 0.0–0.2)

## 2022-12-24 LAB — URINALYSIS, ROUTINE W REFLEX MICROSCOPIC
Bacteria, UA: NONE SEEN
Bilirubin Urine: NEGATIVE
Glucose, UA: NEGATIVE mg/dL
Ketones, ur: NEGATIVE mg/dL
Leukocytes,Ua: NEGATIVE
Nitrite: NEGATIVE
Protein, ur: NEGATIVE mg/dL
Specific Gravity, Urine: 1.015 (ref 1.005–1.030)
pH: 5 (ref 5.0–8.0)

## 2022-12-24 LAB — COMPREHENSIVE METABOLIC PANEL
ALT: 17 U/L (ref 0–44)
AST: 16 U/L (ref 15–41)
Albumin: 3.5 g/dL (ref 3.5–5.0)
Alkaline Phosphatase: 48 U/L (ref 38–126)
Anion gap: 7 (ref 5–15)
BUN: 16 mg/dL (ref 8–23)
CO2: 22 mmol/L (ref 22–32)
Calcium: 8.5 mg/dL — ABNORMAL LOW (ref 8.9–10.3)
Chloride: 109 mmol/L (ref 98–111)
Creatinine, Ser: 0.88 mg/dL (ref 0.44–1.00)
GFR, Estimated: >60 mL/min (ref >60)
Glucose, Bld: 110 mg/dL — ABNORMAL HIGH (ref 70–99)
Potassium: 3.9 mmol/L (ref 3.5–5.1)
Sodium: 138 mmol/L (ref 135–145)
Total Bilirubin: 0.5 mg/dL (ref 0.3–1.2)
Total Protein: 6.3 g/dL — ABNORMAL LOW (ref 6.5–8.1)

## 2022-12-24 LAB — CREATININE, SERUM
Creatinine, Ser: 0.73 mg/dL (ref 0.44–1.00)
GFR, Estimated: >60 mL/min (ref >60)

## 2022-12-24 LAB — LIPASE, BLOOD: Lipase: 31 U/L (ref 11–51)

## 2022-12-24 MED ORDER — SUMATRIPTAN SUCCINATE 50 MG PO TABS: 100.0000 mg | ORAL_TABLET | ORAL | Status: DC | PRN

## 2022-12-24 MED ORDER — ONDANSETRON HCL 4 MG/2ML IJ SOLN
4.0000 mg | Freq: Once | INTRAMUSCULAR | Status: AC
  Administered 2022-12-24: 4 mg via INTRAVENOUS
  Filled 2022-12-24: qty 2

## 2022-12-24 MED ORDER — ENOXAPARIN SODIUM 40 MG/0.4ML IJ SOSY
40.0000 mg | PREFILLED_SYRINGE | INTRAMUSCULAR | Status: DC
  Administered 2022-12-24: 40 mg via SUBCUTANEOUS
  Filled 2022-12-24: qty 0.4

## 2022-12-24 MED ORDER — SODIUM CHLORIDE 0.9 % IV SOLN
12.5000 mg | Freq: Four times a day (QID) | INTRAVENOUS | Status: DC | PRN
  Administered 2022-12-24: 12.5 mg via INTRAVENOUS
  Filled 2022-12-24: qty 0.5

## 2022-12-24 MED ORDER — ACETAMINOPHEN 650 MG RE SUPP: 650.0000 mg | Freq: Four times a day (QID) | RECTAL | Status: DC | PRN

## 2022-12-24 MED ORDER — ONDANSETRON HCL 4 MG/2ML IJ SOLN: 4.0000 mg | Freq: Once | INTRAMUSCULAR | Status: DC

## 2022-12-24 MED ORDER — MORPHINE SULFATE (PF) 4 MG/ML IV SOLN
4.0000 mg | Freq: Once | INTRAVENOUS | Status: AC
  Administered 2022-12-24: 4 mg via INTRAVENOUS
  Filled 2022-12-24: qty 1

## 2022-12-24 MED ORDER — KETOROLAC TROMETHAMINE 15 MG/ML IJ SOLN
15.0000 mg | Freq: Four times a day (QID) | INTRAMUSCULAR | Status: DC | PRN
  Administered 2022-12-24: 15 mg via INTRAVENOUS
  Filled 2022-12-24: qty 1

## 2022-12-24 MED ORDER — PROMETHAZINE HCL 25 MG/ML IJ SOLN
INTRAMUSCULAR | Status: AC
Start: 2022-12-24 — End: ?
  Filled 2022-12-24: qty 1

## 2022-12-24 MED ORDER — OXYCODONE HCL 5 MG PO TABS: 5.0000 mg | ORAL_TABLET | ORAL | Status: DC | PRN

## 2022-12-24 MED ORDER — SODIUM CHLORIDE 0.9 % IV BOLUS: 500.0000 mL | Freq: Once | INTRAVENOUS | Status: DC

## 2022-12-24 MED ORDER — ACETAMINOPHEN 325 MG PO TABS: 650.0000 mg | ORAL_TABLET | Freq: Four times a day (QID) | ORAL | Status: DC | PRN

## 2022-12-24 NOTE — ED Provider Notes (Signed)
[place] EMERGENCY DEPARTMENT AT [facility] Provider Note   CSN: [id number] Arrival date & time: 12/24/22  0401     History  Chief Complaint  Patient presents with   Abdominal Pain    Crystal Price is a 77 y.o. female.  Patient is a 77 year old female with history of hypertension and migraines.  Patient presenting today for evaluation of right flank pain.  This started just prior to arrival and woke her from sleep.  She describes a severe, stabbing pain to her right flank and upper abdomen.  No fevers or chills.  She denies any urinary complaints.  No bowel issues.  No aggravating or alleviating factors.  The history is provided by the patient.       Home Medications Prior to Admission medications   Medication Sig Start Date End Date Taking? Authorizing Provider  cephALEXin (KEFLEX) 500 MG capsule Take 1 capsule (500 mg total) by mouth 4 (four) times daily. 02/22/18   Vanetta Mulders, MD  ondansetron (ZOFRAN ODT) 4 MG disintegrating tablet Take 1 tablet (4 mg total) by mouth every 8 (eight) hours as needed. 02/22/18   Vanetta Mulders, MD  SUMAtriptan (IMITREX) 100 MG tablet Take 100 mg by mouth every 2 (two) hours as needed for migraine. May repeat in 2 hours if headache persists or recurs.    [provider]      Allergies    Patient has no known allergies.    Review of Systems   Review of Systems  All other systems reviewed and are negative.   Physical Exam Updated Vital Signs BP (!) 168/61 (BP Location: Right Arm)   Pulse (!) 57   Temp (!) 97.5 F (36.4 C) (Oral)   Resp 20   SpO2 97%  Physical Exam Vitals and nursing note reviewed.  Constitutional:      General: She is not in acute distress.    Appearance: She is well-developed. She is not diaphoretic.  HENT:     Head: Normocephalic and atraumatic.  Cardiovascular:     Rate and Rhythm: Normal rate and regular rhythm.     Heart sounds: No murmur heard.    No friction rub. No gallop.   Pulmonary:     Effort: Pulmonary effort is normal. No respiratory distress.     Breath sounds: Normal breath sounds. No wheezing.  Abdominal:     General: Bowel sounds are normal. There is no distension.     Palpations: Abdomen is soft.     Tenderness: There is abdominal tenderness in the right upper quadrant. There is right CVA tenderness. There is no left CVA tenderness, guarding or rebound.  Musculoskeletal:        General: Normal range of motion.     Cervical back: Normal range of motion and neck supple.  Skin:    General: Skin is warm and dry.  Neurological:     General: No focal deficit present.     Mental Status: She is alert and oriented to person, place, and time.     ED Results / Procedures / Treatments   Labs (all labs ordered are listed, but only abnormal results are displayed) Labs Reviewed - No data to display  EKG None  Radiology No results found.  Procedures Procedures  {Document cardiac monitor, telemetry assessment procedure when appropriate:1}  Medications Ordered in ED Medications  ondansetron (ZOFRAN) injection 4 mg (has no administration in time range)  sodium chloride 0.9 % bolus 500 mL (has no administration  in time range)    ED Course/ Medical Decision Making/ A&P   {   Click here for ABCD2, HEART and other calculatorsREFRESH Note before signing :1}                              Medical Decision Making Amount and/or Complexity of Data Reviewed Radiology: ordered.  Risk Prescription drug management.   ***  {Document critical care time when appropriate:1} {Document review of labs and clinical decision tools ie heart score, Chads2Vasc2 etc:1}  {Document your independent review of radiology images, and any outside records:1} {Document your discussion with family members, caretakers, and with consultants:1} {Document social determinants of health affecting pt's care:1} {Document your decision making why or why not admission, treatments  were needed:1} Final Clinical Impression(s) / ED Diagnoses Final diagnoses:  None    Rx / DC Orders ED Discharge Orders     None

## 2022-12-24 NOTE — H&P (Signed)
History and Physical    SOMER DUKE Price:[id number] DOB: Sep 09, 1945 DOA: 12/24/2022  PCP: Juliette Alcide, MD   Patient coming from: Home  Chief Complaint: Right flank pain  HPI: Crystal Price is a 77 y.o. female with medical history significant for migraine headaches who presented to the ED with sudden onset of right upper quadrant abdominal pain and flank pain that woke her up from sleep at approximately 3 AM.  She rated her pain 10/10 and EMS was called.  She was given morphine and Zofran and route per EMS.  She did not have any nausea, vomiting, fevers, chills, or diarrhea when the pain started.  According to son at bedside,  it appears that she has been doing a lot of bending, lifting, and straining in the last 1 week that likely has contributed to her pain.  She denies any other symptomatology.   ED Course: Vital signs stable and laboratory data within normal limits.  Urine analysis with no significant findings noted.  CT scan demonstrating 8 mm left renal calculus that is nonobstructive.  She continues to report nausea and vomiting.  Review of Systems: Reviewed as noted above, otherwise negative.  Past Medical History:  Diagnosis Date   Cataract    left eye   Hypertension    Migraine     Past Surgical History:  Procedure Laterality Date   TUBAL LIGATION       reports that she has never smoked. She has never used smokeless tobacco. She reports current alcohol use. She reports that she does not use drugs.  No Known Allergies  History reviewed. No pertinent family history.  Prior to Admission medications   Medication Sig Start Date End Date Taking? Authorizing Provider  cephALEXin (KEFLEX) 500 MG capsule Take 1 capsule (500 mg total) by mouth 4 (four) times daily. 02/22/18   Vanetta Mulders, MD  ondansetron (ZOFRAN ODT) 4 MG disintegrating tablet Take 1 tablet (4 mg total) by mouth every 8 (eight) hours as needed. 02/22/18   Vanetta Mulders, MD  SUMAtriptan (IMITREX) 100  MG tablet Take 100 mg by mouth every 2 (two) hours as needed for migraine. May repeat in 2 hours if headache persists or recurs.    [provider]    Physical Exam: Vitals:   12/24/22 0410 12/24/22 0413 12/24/22 0500  BP: (!) 168/61  (!) 176/73  Pulse: (!) 57  60  Resp: 20  15  Temp: (!) 97.5 F (36.4 C)    TempSrc: Oral    SpO2: 97%  (!) 88%  Weight:  63.5 kg   Height:  5\' 3"  (1.6 m)     Constitutional: NAD, calm, comfortable Vitals:   12/24/22 0410 12/24/22 0413 12/24/22 0500  BP: (!) 168/61  (!) 176/73  Pulse: (!) 57  60  Resp: 20  15  Temp: (!) 97.5 F (36.4 C)    TempSrc: Oral    SpO2: 97%  (!) 88%  Weight:  63.5 kg   Height:  5\' 3"  (1.6 m)    Eyes: lids and conjunctivae normal Neck: normal, supple Respiratory: clear to auscultation bilaterally. Normal respiratory effort. No accessory muscle use.  Cardiovascular: Regular rate and rhythm, no murmurs. Abdomen: no tenderness, no distention. Bowel sounds positive.  Musculoskeletal:  No edema.  Right flank tender to palpation. Skin: no rashes, lesions, ulcers.  Psychiatric: Flat affect  Labs on Admission: I have personally reviewed following labs and imaging studies  CBC: Recent Labs  Lab 12/24/22 0527  WBC 7.1  HGB 12.1  HCT 35.1*  MCV 95.1  PLT 355   Basic Metabolic Panel: Recent Labs  Lab 12/24/22 0527  NA 138  K 3.9  CL 109  CO2 22  GLUCOSE 110*  BUN 16  CREATININE 0.88  CALCIUM 8.5*   GFR: Estimated Creatinine Clearance: 48 mL/min (by C-G formula based on SCr of 0.88 mg/dL). Liver Function Tests: Recent Labs  Lab 12/24/22 0527  AST 16  ALT 17  ALKPHOS 48  BILITOT 0.5  PROT 6.3*  ALBUMIN 3.5   Recent Labs  Lab 12/24/22 0527  LIPASE 31   No results for input(s): "AMMONIA" in the last 168 hours. Coagulation Profile: No results for input(s): "INR", "PROTIME" in the last 168 hours. Cardiac Enzymes: No results for input(s): "CKTOTAL", "CKMB", "CKMBINDEX", "TROPONINI" in  the last 168 hours. BNP (last 3 results) No results for input(s): "PROBNP" in the last 8760 hours. HbA1C: No results for input(s): "HGBA1C" in the last 72 hours. CBG: No results for input(s): "GLUCAP" in the last 168 hours. Lipid Profile: No results for input(s): "CHOL", "HDL", "LDLCALC", "TRIG", "CHOLHDL", "LDLDIRECT" in the last 72 hours. Thyroid Function Tests: No results for input(s): "TSH", "T4TOTAL", "FREET4", "T3FREE", "THYROIDAB" in the last 72 hours. Anemia Panel: No results for input(s): "VITAMINB12", "FOLATE", "FERRITIN", "TIBC", "IRON", "RETICCTPCT" in the last 72 hours. Urine analysis:    Component Value Date/Time   COLORURINE YELLOW 12/24/2022 0922   APPEARANCEUR CLEAR 12/24/2022 0922   LABSPEC 1.015 12/24/2022 0922   PHURINE 5.0 12/24/2022 0922   GLUCOSEU NEGATIVE 12/24/2022 0922   HGBUR SMALL (A) 12/24/2022 0922   BILIRUBINUR NEGATIVE 12/24/2022 0922   KETONESUR NEGATIVE 12/24/2022 0922   PROTEINUR NEGATIVE 12/24/2022 0922   NITRITE NEGATIVE 12/24/2022 0922   LEUKOCYTESUR NEGATIVE 12/24/2022 6578    Radiological Exams on Admission: CT Renal Stone Study  Result Date: 12/24/2022 CLINICAL DATA:  77 year old female with history of right upper quadrant abdominal pain which woke her from sleep. EXAM: CT ABDOMEN AND PELVIS WITHOUT CONTRAST TECHNIQUE: Multidetector CT imaging of the abdomen and pelvis was performed following the standard protocol without IV contrast. RADIATION DOSE REDUCTION: This exam was performed according to the departmental dose-optimization program which includes automated exposure control, adjustment of the mA and/or kV according to patient size and/or use of iterative reconstruction technique. COMPARISON:  CT of the abdomen and pelvis 02/22/2018. FINDINGS: Lower chest: Cardiomegaly. Atherosclerotic calcifications in the thoracic aorta and right coronary artery. Small hiatal hernia. Scattered areas of scarring in the visualize lung bases.  Hepatobiliary: No suspicious cystic or solid hepatic lesions are confidently identified on today's noncontrast CT examination. Unenhanced appearance of the gallbladder is unremarkable. Pancreas: No definite pancreatic mass or peripancreatic fluid collections or inflammatory changes are noted on today's noncontrast CT examination. Spleen: Unremarkable. Adrenals/Urinary Tract: 8 mm nonobstructive calculus in the lower pole collecting system of the left kidney. No additional calculi are noted within the right renal collecting system, along the course of either ureter, or within the lumen of the urinary bladder. No hydroureteronephrosis. Urinary bladder is unremarkable in appearance. Bilateral adrenal glands are normal in appearance. Stomach/Bowel: The appearance of the stomach is normal. No pathologic dilatation of small bowel or colon. Normal appendix. Vascular/Lymphatic: Atherosclerosis in the abdominal aorta and pelvic vasculature. No lymphadenopathy noted in the abdomen or pelvis. Reproductive: Uterus and left ovary are unremarkable in appearance. Well-defined centrally low-attenuation right adnexal structure measuring 5.2 x 3.1 cm with small dependent calcification, similar to prior study from 02/22/2018,  presumably a benign cystic lesion (no imaging follow-up recommended). Other: No significant volume of ascites.  No pneumoperitoneum. Musculoskeletal: There are no aggressive appearing lytic or blastic lesions noted in the visualized portions of the skeleton. IMPRESSION: 1. 8 mm nonobstructive calculus in the lower pole collecting system of the left kidney. No other acute findings are noted in the abdomen or pelvis to account for the patient's symptoms. 2. Aortic atherosclerosis. 3. Additional incidental findings, as above. Electronically Signed   By: Trudie Reed M.D.   On: 12/24/2022 06:40    Assessment/Plan Principal Problem:   Abdominal pain Active Problems:   Intractable nausea and vomiting    Essential hypertension    Abdominal pain likely secondary to musculoskeletal injury -Pain is quite severe and therefore patient will be observed -Continue pain management with nonnarcotic medications -PT evaluation  Intractable nausea and vomiting -Secondary to morphine administration -Continue to monitor closely  Essential hypertension -Does not appear to be on home medications, continue to monitor  DVT prophylaxis: Lovenox Code Status: Full Family Communication: Son and sister at bedside 10/20 Disposition Plan:Admit for evaluation of pain Consults called:None Admission status: Obs, Med/Surg  Severity of Illness: The appropriate patient status for this patient is OBSERVATION. Observation status is judged to be reasonable and necessary in order to provide the required intensity of service to ensure the patient's safety. The patient's presenting symptoms, physical exam findings, and initial radiographic and laboratory data in the context of their medical condition is felt to place them at decreased risk for further clinical deterioration. Furthermore, it is anticipated that the patient will be medically stable for discharge from the hospital within 2 midnights of admission.    Tesia Lybrand D Sherryll Burger DO [facility]  If 7PM-7AM, please contact night-coverage [email or web address]  12/24/2022, 10:48 AM

## 2022-12-24 NOTE — ED Triage Notes (Signed)
Pt bib EMS after she had a sudden onset of RUQ abdominal pain that woke her from her sleep. Per EMS, pt was rating pain as 10/10. Pt was given 4mg  Zofran and a total of 10mg  Morphine via EMS.

## 2022-12-24 NOTE — Evaluation (Signed)
Physical Therapy Brief Evaluation and Discharge Note Patient Details Name: Crystal Price MRN: [id number] DOB: 29-Jan-1946 Today's Date: 12/24/2022   History of Present Illness  Crystal Price is a 77 y.o. female with medical history significant for migraine headaches who presented to the ED with sudden onset of right upper quadrant abdominal pain and flank pain that woke her up from sleep at approximately 3 AM.  She rated her pain 10/10 and EMS was called.  She was given morphine and Zofran and route per EMS.  She did not have any nausea, vomiting, fevers, chills, or diarrhea when the pain started.  According to son at bedside,  it appears that she has been doing a lot of bending, lifting, and straining in the last 1 week that likely has contributed to her pain.  She denies any other symptomatology.   Clinical Impression  PT unsteady without assistive device but has cane and rolling walker at home.  Pt feels unsteadiness is due to pain medication.  PT states no pain with meds.        PT Assessment  Pt is Mod I in all mobility   Assistance Needed at Discharge   Assist for steps but pt states she has no steps    Equipment Recommendations  None   Recommendations for Other Services    none   Precautions/Restrictions  none           Pertinent Vitals/Pain  None but has had pain medicaton      Home Living  Lives with son                  Prior Function  I      UE/LE Assessment    WFL           Communication    normal     Cognition  normal              Assessment/Plan    PT Problem List  Unsteady on feet       PT Visit Diagnosis   Unsteady on feet   No Skilled PT  No skilled PT needed at this time     End of Session  PT in bed with visitor             Time: 1109 - 1130    Charges:  EVal         12/24/2022, 11:07 AM

## 2022-12-25 DIAGNOSIS — R1011 Right upper quadrant pain: Secondary | ICD-10-CM | POA: Diagnosis not present

## 2022-12-25 LAB — CBC
HCT: 34.1 % — ABNORMAL LOW (ref 36.0–46.0)
Hemoglobin: 11.3 g/dL — ABNORMAL LOW (ref 12.0–15.0)
MCH: 32.4 pg (ref 26.0–34.0)
MCHC: 33.1 g/dL (ref 30.0–36.0)
MCV: 97.7 fL (ref 80.0–100.0)
Platelets: 309 10*3/uL (ref 150–400)
RBC: 3.49 MIL/uL — ABNORMAL LOW (ref 3.87–5.11)
RDW: 13.7 % (ref 11.5–15.5)
WBC: 6.2 10*3/uL (ref 4.0–10.5)
nRBC: 0 % (ref 0.0–0.2)

## 2022-12-25 LAB — BASIC METABOLIC PANEL
Anion gap: 7 (ref 5–15)
BUN: 24 mg/dL — ABNORMAL HIGH (ref 8–23)
CO2: 25 mmol/L (ref 22–32)
Calcium: 8.4 mg/dL — ABNORMAL LOW (ref 8.9–10.3)
Chloride: 106 mmol/L (ref 98–111)
Creatinine, Ser: 0.84 mg/dL (ref 0.44–1.00)
GFR, Estimated: >60 mL/min (ref >60)
Glucose, Bld: 91 mg/dL (ref 70–99)
Potassium: 4 mmol/L (ref 3.5–5.1)
Sodium: 138 mmol/L (ref 135–145)

## 2022-12-25 LAB — MAGNESIUM: Magnesium: 2.3 mg/dL (ref 1.7–2.4)

## 2022-12-25 MED ORDER — CYCLOBENZAPRINE HCL 5 MG PO TABS: 5.0000 mg | ORAL_TABLET | Freq: Three times a day (TID) | ORAL | 0 refills | Status: AC | PRN

## 2022-12-25 NOTE — Discharge Summary (Signed)
Physician Discharge Summary  Crystal Price UYQ:[id number] DOB: 1945/11/01 DOA: 12/24/2022  PCP: Juliette Alcide, MD  Admit date: 12/24/2022  Discharge date: 12/25/2022  Admitted From:Home  Disposition:  Home  Recommendations for Outpatient Follow-up:  Follow up with PCP in 1-2 weeks Muscle relaxers prescribed as needed for any ongoing pain or acute muscle spasms Continue other home medications as prior  Home Health: None  Equipment/Devices: None  Discharge Condition:Stable  CODE STATUS: Full  Diet recommendation: Heart Healthy  Brief/Interim Summary: Crystal Price is a 77 y.o. female with medical history significant for migraine headaches who presented to the ED with sudden onset of right upper quadrant abdominal pain and flank pain that woke her up from sleep at approximately 3 AM.  She rated her pain 10/10 and EMS was called.  She was given morphine and Zofran and route per EMS.  She did not have any nausea, vomiting, fevers, chills, or diarrhea when the pain started.  According to son at bedside,  it appears that she has been doing a lot of bending, lifting, and straining in the last 1 week that likely has contributed to her pain.  She denies any other symptomatology.   She was admitted with likely musculoskeletal sprain/strain and pain has overall improved and she is now tolerating diet.  She is in stable condition for discharge at this time with no other acute events or concerns noted throughout this brief admission.  Discharge Diagnoses:  Principal Problem:   Abdominal pain Active Problems:   Intractable nausea and vomiting   Essential hypertension  Principal discharge diagnosis: Right flank pain secondary to musculoskeletal sprain/strain.  Presence of left-sided nephrolithiasis, nonobstructing.  Discharge Instructions  Discharge Instructions     Diet - low sodium heart healthy   Complete by: As directed    Increase activity slowly   Complete by: As directed        Allergies as of 12/25/2022   No Known Allergies      Medication List     TAKE these medications    cyclobenzaprine 5 MG tablet Commonly known as: FLEXERIL Take 1 tablet (5 mg total) by mouth 3 (three) times daily as needed for muscle spasms.   propranolol 20 MG tablet Commonly known as: INDERAL Take 20 mg by mouth 2 (two) times daily.   SUMAtriptan 100 MG tablet Commonly known as: IMITREX Take 100 mg by mouth every 2 (two) hours as needed for migraine. May repeat in 2 hours if headache persists or recurs.        Follow-up Information     Burdine, Ananias Pilgrim, MD. Schedule an appointment as soon as possible for a visit in 1 week(s).   Specialty: Family Medicine Contact information: [address] [place] [address] [phone]                No Known Allergies  Consultations: None   Procedures/Studies: CT Renal Stone Study  Result Date: 12/24/2022 CLINICAL DATA:  77 year old female with history of right upper quadrant abdominal pain which woke her from sleep. EXAM: CT ABDOMEN AND PELVIS WITHOUT CONTRAST TECHNIQUE: Multidetector CT imaging of the abdomen and pelvis was performed following the standard protocol without IV contrast. RADIATION DOSE REDUCTION: This exam was performed according to the departmental dose-optimization program which includes automated exposure control, adjustment of the mA and/or kV according to patient size and/or use of iterative reconstruction technique. COMPARISON:  CT of the abdomen and pelvis 02/22/2018. FINDINGS: Lower chest: Cardiomegaly. Atherosclerotic calcifications  in the thoracic aorta and right coronary artery. Small hiatal hernia. Scattered areas of scarring in the visualize lung bases. Hepatobiliary: No suspicious cystic or solid hepatic lesions are confidently identified on today's noncontrast CT examination. Unenhanced appearance of the gallbladder is unremarkable. Pancreas: No definite pancreatic mass or peripancreatic fluid  collections or inflammatory changes are noted on today's noncontrast CT examination. Spleen: Unremarkable. Adrenals/Urinary Tract: 8 mm nonobstructive calculus in the lower pole collecting system of the left kidney. No additional calculi are noted within the right renal collecting system, along the course of either ureter, or within the lumen of the urinary bladder. No hydroureteronephrosis. Urinary bladder is unremarkable in appearance. Bilateral adrenal glands are normal in appearance. Stomach/Bowel: The appearance of the stomach is normal. No pathologic dilatation of small bowel or colon. Normal appendix. Vascular/Lymphatic: Atherosclerosis in the abdominal aorta and pelvic vasculature. No lymphadenopathy noted in the abdomen or pelvis. Reproductive: Uterus and left ovary are unremarkable in appearance. Well-defined centrally low-attenuation right adnexal structure measuring 5.2 x 3.1 cm with small dependent calcification, similar to prior study from 02/22/2018, presumably a benign cystic lesion (no imaging follow-up recommended). Other: No significant volume of ascites.  No pneumoperitoneum. Musculoskeletal: There are no aggressive appearing lytic or blastic lesions noted in the visualized portions of the skeleton. IMPRESSION: 1. 8 mm nonobstructive calculus in the lower pole collecting system of the left kidney. No other acute findings are noted in the abdomen or pelvis to account for the patient's symptoms. 2. Aortic atherosclerosis. 3. Additional incidental findings, as above. Electronically Signed   By: Trudie Reed M.D.   On: 12/24/2022 06:40     Discharge Exam: Vitals:   12/25/22 0151 12/25/22 0538  BP: (!) 105/54 (!) 136/57  Pulse: (!) 54 (!) 58  Resp: 16   Temp: 98 F (36.7 C) 97.9 F (36.6 C)  SpO2: 95% 96%   Vitals:   12/24/22 1818 12/24/22 2147 12/25/22 0151 12/25/22 0538  BP: (!) 134/42 (!) 114/39 (!) 105/54 (!) 136/57  Pulse: (!) 54 61 (!) 54 (!) 58  Resp: 18 16 16    Temp:  97.6 F (36.4 C) 97.9 F (36.6 C) 98 F (36.7 C) 97.9 F (36.6 C)  TempSrc: Oral Oral Oral Oral  SpO2: 94% 95% 95% 96%  Weight:      Height:        General: Pt is alert, awake, not in acute distress Cardiovascular: RRR, S1/S2 +, no rubs, no gallops Respiratory: CTA bilaterally, no wheezing, no rhonchi Abdominal: Soft, NT, ND, bowel sounds + Extremities: no edema, no cyanosis    The results of significant diagnostics from this hospitalization (including imaging, microbiology, ancillary and laboratory) are listed below for reference.     Microbiology: No results found for this or any previous visit (from the past 240 hour(s)).   Labs: BNP (last 3 results) No results for input(s): "BNP" in the last 8760 hours. Basic Metabolic Panel: Recent Labs  Lab 12/24/22 0527 12/24/22 1146 12/25/22 0438  NA 138  --  138  K 3.9  --  4.0  CL 109  --  106  CO2 22  --  25  GLUCOSE 110*  --  91  BUN 16  --  24*  CREATININE 0.88 0.73 0.84  CALCIUM 8.5*  --  8.4*  MG  --   --  2.3   Liver Function Tests: Recent Labs  Lab 12/24/22 0527  AST 16  ALT 17  ALKPHOS 48  BILITOT 0.5  PROT  6.3*  ALBUMIN 3.5   Recent Labs  Lab 12/24/22 0527  LIPASE 31   No results for input(s): "AMMONIA" in the last 168 hours. CBC: Recent Labs  Lab 12/24/22 0527 12/24/22 1146 12/25/22 0438  WBC 7.1 7.4 6.2  HGB 12.1 12.7 11.3*  HCT 35.1* 38.0 34.1*  MCV 95.1 97.4 97.7  PLT 355 368 309   Cardiac Enzymes: No results for input(s): "CKTOTAL", "CKMB", "CKMBINDEX", "TROPONINI" in the last 168 hours. BNP: Invalid input(s): "POCBNP" CBG: No results for input(s): "GLUCAP" in the last 168 hours. D-Dimer No results for input(s): "DDIMER" in the last 72 hours. Hgb A1c No results for input(s): "HGBA1C" in the last 72 hours. Lipid Profile No results for input(s): "CHOL", "HDL", "LDLCALC", "TRIG", "CHOLHDL", "LDLDIRECT" in the last 72 hours. Thyroid function studies No results for input(s):  "TSH", "T4TOTAL", "T3FREE", "THYROIDAB" in the last 72 hours.  Invalid input(s): "FREET3" Anemia work up No results for input(s): "VITAMINB12", "FOLATE", "FERRITIN", "TIBC", "IRON", "RETICCTPCT" in the last 72 hours. Urinalysis    Component Value Date/Time   COLORURINE YELLOW 12/24/2022 0922   APPEARANCEUR CLEAR 12/24/2022 0922   LABSPEC 1.015 12/24/2022 0922   PHURINE 5.0 12/24/2022 0922   GLUCOSEU NEGATIVE 12/24/2022 0922   HGBUR SMALL (A) 12/24/2022 0922   BILIRUBINUR NEGATIVE 12/24/2022 0922   KETONESUR NEGATIVE 12/24/2022 0922   PROTEINUR NEGATIVE 12/24/2022 0922   NITRITE NEGATIVE 12/24/2022 0922   LEUKOCYTESUR NEGATIVE 12/24/2022 0922   Sepsis Labs Recent Labs  Lab 12/24/22 0527 12/24/22 1146 12/25/22 0438  WBC 7.1 7.4 6.2   Microbiology No results found for this or any previous visit (from the past 240 hour(s)).   Time coordinating discharge: 35 minutes  SIGNED:   Erick Blinks, DO [facility] 12/25/2022, 10:35 AM  If 7PM-7AM, please contact night-coverage [email or web address]

## 2022-12-25 NOTE — Progress Notes (Signed)
Nsg Discharge Note  Admit Date:  12/24/2022 Discharge date: 12/25/2022   Crystal Price to be D/C'd Home per MD order.  AVS completed.  Copy for chart, and copy for patient signed, and dated. Patient/caregiver able to verbalize understanding.  Discharge Medication: Allergies as of 12/25/2022   No Known Allergies      Medication List     TAKE these medications    cyclobenzaprine 5 MG tablet Commonly known as: FLEXERIL Take 1 tablet (5 mg total) by mouth 3 (three) times daily as needed for muscle spasms.   propranolol 20 MG tablet Commonly known as: INDERAL Take 20 mg by mouth 2 (two) times daily.   SUMAtriptan 100 MG tablet Commonly known as: IMITREX Take 100 mg by mouth every 2 (two) hours as needed for migraine. May repeat in 2 hours if headache persists or recurs.        Discharge Assessment: Vitals:   12/25/22 0151 12/25/22 0538  BP: (!) 105/54 (!) 136/57  Pulse: (!) 54 (!) 58  Resp: 16   Temp: 98 F (36.7 C) 97.9 F (36.6 C)  SpO2: 95% 96%   Skin clean, dry and intact without evidence of skin break down, no evidence of skin tears noted. IV catheter discontinued intact. Site without signs and symptoms of complications - no redness or edema noted at insertion site, patient denies c/o pain - only slight tenderness at site.  Dressing with slight pressure applied.  D/c Instructions-Education: Discharge instructions given to patient/family with verbalized understanding. D/c education completed with patient/family including follow up instructions, medication list, d/c activities limitations if indicated, with other d/c instructions as indicated by MD - patient able to verbalize understanding, all questions fully answered. Patient instructed to return to ED, call 911, or call MD for any changes in condition.  Patient escorted via WC, and D/C home via private auto.  Demetrio Lapping, LPN 16/12/9602 54:09 AM

## 2023-01-01 DIAGNOSIS — M545 Low back pain, unspecified: Secondary | ICD-10-CM | POA: Diagnosis not present

## 2023-01-01 DIAGNOSIS — R319 Hematuria, unspecified: Secondary | ICD-10-CM | POA: Diagnosis not present

## 2023-01-01 DIAGNOSIS — M62838 Other muscle spasm: Secondary | ICD-10-CM | POA: Diagnosis not present

## 2023-01-24 DIAGNOSIS — M545 Low back pain, unspecified: Secondary | ICD-10-CM | POA: Diagnosis not present

## 2023-01-24 DIAGNOSIS — M62838 Other muscle spasm: Secondary | ICD-10-CM | POA: Diagnosis not present

## 2023-01-24 DIAGNOSIS — R319 Hematuria, unspecified: Secondary | ICD-10-CM | POA: Diagnosis not present

## 2023-02-07 DIAGNOSIS — I10 Essential (primary) hypertension: Secondary | ICD-10-CM | POA: Diagnosis not present

## 2023-02-07 DIAGNOSIS — R519 Headache, unspecified: Secondary | ICD-10-CM | POA: Diagnosis not present

## 2023-02-07 DIAGNOSIS — Z0000 Encounter for general adult medical examination without abnormal findings: Secondary | ICD-10-CM | POA: Diagnosis not present

## 2023-02-07 DIAGNOSIS — Z23 Encounter for immunization: Secondary | ICD-10-CM | POA: Diagnosis not present

## 2023-07-12 DIAGNOSIS — Z1231 Encounter for screening mammogram for malignant neoplasm of breast: Secondary | ICD-10-CM | POA: Diagnosis not present

## 2023-08-14 DIAGNOSIS — R350 Frequency of micturition: Secondary | ICD-10-CM | POA: Diagnosis not present

## 2023-08-14 DIAGNOSIS — R300 Dysuria: Secondary | ICD-10-CM | POA: Diagnosis not present

## 2023-08-27 DIAGNOSIS — H35361 Drusen (degenerative) of macula, right eye: Secondary | ICD-10-CM | POA: Diagnosis not present

## 2023-08-27 DIAGNOSIS — H524 Presbyopia: Secondary | ICD-10-CM | POA: Diagnosis not present

## 2023-11-20 DIAGNOSIS — R42 Dizziness and giddiness: Secondary | ICD-10-CM | POA: Diagnosis not present

## 2023-11-20 DIAGNOSIS — R519 Headache, unspecified: Secondary | ICD-10-CM | POA: Diagnosis not present

## 2023-11-20 DIAGNOSIS — R350 Frequency of micturition: Secondary | ICD-10-CM | POA: Diagnosis not present

## 2023-11-20 DIAGNOSIS — R110 Nausea: Secondary | ICD-10-CM | POA: Diagnosis not present

## 2023-11-20 DIAGNOSIS — I6523 Occlusion and stenosis of bilateral carotid arteries: Secondary | ICD-10-CM | POA: Diagnosis not present
